# Patient Record
Sex: Female | Born: 1980 | Race: Black or African American | Hispanic: No | Marital: Single | Smoking: Current every day smoker
Health system: Southern US, Community
[De-identification: ages and names within clinical notes are randomized; demographics above are authoritative.]

## PROBLEM LIST (undated history)

## (undated) DIAGNOSIS — D649 Anemia, unspecified: Secondary | ICD-10-CM

## (undated) HISTORY — DX: Anemia, unspecified: D64.9

## (undated) HISTORY — PX: TUBAL LIGATION: SHX77

---

## 2001-03-05 ENCOUNTER — Emergency Department (HOSPITAL_COMMUNITY): Admission: EM | Admit: 2001-03-05 | Discharge: 2001-03-05 | Payer: Self-pay | Admitting: *Deleted

## 2001-04-17 ENCOUNTER — Encounter: Payer: Self-pay | Admitting: *Deleted

## 2001-04-17 ENCOUNTER — Ambulatory Visit (HOSPITAL_COMMUNITY): Admission: RE | Admit: 2001-04-17 | Discharge: 2001-04-17 | Payer: Self-pay | Admitting: *Deleted

## 2001-06-12 ENCOUNTER — Ambulatory Visit (HOSPITAL_COMMUNITY): Admission: RE | Admit: 2001-06-12 | Discharge: 2001-06-13 | Payer: Self-pay | Admitting: *Deleted

## 2001-06-13 ENCOUNTER — Encounter: Payer: Self-pay | Admitting: *Deleted

## 2001-07-02 ENCOUNTER — Ambulatory Visit (HOSPITAL_COMMUNITY): Admission: RE | Admit: 2001-07-02 | Discharge: 2001-07-02 | Payer: Self-pay | Admitting: *Deleted

## 2001-07-28 ENCOUNTER — Ambulatory Visit (HOSPITAL_COMMUNITY): Admit: 2001-07-28 | Discharge: 2001-07-28 | Payer: Self-pay | Admitting: *Deleted

## 2001-08-09 ENCOUNTER — Ambulatory Visit (HOSPITAL_COMMUNITY): Admission: AD | Admit: 2001-08-09 | Discharge: 2001-08-09 | Payer: Self-pay | Admitting: *Deleted

## 2001-08-20 ENCOUNTER — Inpatient Hospital Stay (HOSPITAL_COMMUNITY): Admission: AD | Admit: 2001-08-20 | Discharge: 2001-08-24 | Payer: Self-pay | Admitting: *Deleted

## 2001-08-22 ENCOUNTER — Encounter: Payer: Self-pay | Admitting: *Deleted

## 2001-08-26 ENCOUNTER — Encounter: Payer: Self-pay | Admitting: Internal Medicine

## 2001-08-27 ENCOUNTER — Inpatient Hospital Stay (HOSPITAL_COMMUNITY): Admission: EM | Admit: 2001-08-27 | Discharge: 2001-08-31 | Payer: Self-pay | Admitting: Internal Medicine

## 2001-08-27 ENCOUNTER — Encounter: Payer: Self-pay | Admitting: *Deleted

## 2001-08-30 ENCOUNTER — Encounter: Payer: Self-pay | Admitting: General Surgery

## 2001-09-18 ENCOUNTER — Observation Stay (HOSPITAL_COMMUNITY): Admission: RE | Admit: 2001-09-18 | Discharge: 2001-09-19 | Payer: Self-pay | Admitting: *Deleted

## 2001-12-12 ENCOUNTER — Observation Stay (HOSPITAL_COMMUNITY): Admission: RE | Admit: 2001-12-12 | Discharge: 2001-12-13 | Payer: Self-pay | Admitting: *Deleted

## 2002-10-10 ENCOUNTER — Encounter: Payer: Self-pay | Admitting: Internal Medicine

## 2002-10-10 ENCOUNTER — Emergency Department (HOSPITAL_COMMUNITY): Admission: EM | Admit: 2002-10-10 | Discharge: 2002-10-10 | Payer: Self-pay | Admitting: Internal Medicine

## 2002-11-02 ENCOUNTER — Emergency Department (HOSPITAL_COMMUNITY): Admission: EM | Admit: 2002-11-02 | Discharge: 2002-11-02 | Payer: Self-pay | Admitting: Emergency Medicine

## 2003-10-13 IMAGING — CT CT PELVIS W/ CM
1 of 2 series · 13 of 32 positions shown, 18 images · IV contrast (CONTRAST)
Comparison: none

FINDINGS
CLINICAL DATA: LOWER  ABDOMINAL PAIN, CRAMPS.  FEVER.  ELEVATED WHITE COUNT.  REPORTEDLY, NO BOWEL
MOVEMENT FOR SIX DAYS.  C-SECTION SIX DAYS AGO.
CT ABDOMEN WITH CONTRAST
AXIAL CUTS WERE OBTAINED FOLLOWING ORAL GASTROGRAFIN INGESTION AND IV INFUSION OF 100 CC OMNIPAQUE
300.  THE HEART APPEARS SLIGHTLY ENLARGED, WHICH MAY BE RELATED TO RECENT GRAVID STATE OR POSSIBLY
UNDERLYING PATHOLOGY SUCH AS HYPERTENSIVE CARDIOVASCULAR DISEASE.  MINIMAL PERICARDIAL EFFUSION.
MODERATELY SMALL BILATERAL PLEURAL EFFUSIONS.  BILATERAL LOWER LOBAR PARENCHYMAL LUNG DENSITIES ARE
NOTED.  THERE ARE AIR BRONCHOGRAMS.  I AM CONCERNED THAT THE FINDINGS REPRESENT PNEUMONIA IN
ADDITION TO ATELECTASIS, SOMEWHAT MORE SO ON THE RIGHT THAN THE LEFT.  NO BILIARY DUCTAL DILATATION
OR HEPATIC PARENCHYMAL ABNORMALITY.  THE SPLEEN IS UNREMARKABLE.  NO DEFINITE PANCREATIC
ABNORMALITY.  THE GALLBLADDER APPEARS INTACT.  MILD ASCITES, MAINLY IN THE ANTERIOR PARARENAL
SPACES AND PARACOLIC GUTTERS.  MARKED DILATATION OF THE RENAL COLLECTING SYSTEMS AND URETERS. THE
KIDNEYS DO EXCRETE CONTRAST MATERIAL AS NOTED ON THE DELAYED IMAGES.  LAYERING OF CONTRAST IN THE
DILATED COLLECTING SYSTEMS AND RENAL PELVES IS NOTED.  SMALL AMOUNT OF CONTRAST IS NOTED LAYERING
IN THE DILATED RIGHT URETER.  THE DILATED URETERS CAN THEN BE FOLLOWED DOWN INTO THE PELVIS,
CONTRAST IS NOTED IN MULTIPLE SMALL BOWEL LOOPS.  GAS AND STOOL IN THE COLON IS PRESENT.  FINDINGS
MOST LIKELY REPRESENT AN ILEUS.
IMPRESSION
CT FINDINGS ARE MOST COMPATIBLE WITH ILEUS.  MILD ASCITES, THE ETIOLOGY OF WHICH IS UNCERTAIN.
SMALL PLEURAL EFFUSIONS AND PERICARDIAL EFFUSIONS.  BILATERAL LOWER LOBAR ATELECTASIS/PNEUMONIC
CONSOLIDATIONS.  RATHER MARKED BILATERAL HYDROURETERONEPHROSIS WHICH IN PART MAY BE DUE TO A
DISTENDED URINARY BLADDER AND ALSO PROBABLY DUE TO THE FACT THE PATIENT WAS  RECENTLY IN THE GRAVID
STATE.  THERE  MAY BE SOME RESIDUAL COMPRESSION OF THE URETERS AT THE PELVIC BRIM BY ENLARGED
UTERUS.  RECOMMEND BLADDER CATHETERIZATION.
CT PELVIS WITH CONTRAST
300.  UTERUS IS ENLARGED AND HAS AN INHOMOGENEOUS ATTENUATION COMPATIBLE WITH RECENT POST GRAVID
STATE AND C-SECTION.  THERE IS MILD PELVIC ASCITES.  OVARIES ARE SOMEWHAT PROMINENT IN SIZE
BILATERALLY AND MAY BE RELATED TO THE HORMONAL STATUS.  SOMEWHAT HOURGLASS SHAPED FLUID COLLECTION
VENTRAL AND SUPERIOR TO THE BLADDER IS NOTED THAT MEASURES APPROXIMATELY 3 CM IN MAXIMUM DEPTH AND
APPROXIMATELY 9 CM TRANSVERSELY.  OVAL FLUID COLLECTION IN RECTOUTERINE CUL-DE-SAC APPEARS FAIRLY
WELL DIVIDED AND MAY REPRESENT LOCULATED ASCITES, ABSCESS, OR POSSIBLY SEROMA/HEMATOMA.
POSTOPERATIVE HEMATOMA/SEROMA.  THE URETERS ARE DILATED DOWN TO THE LEVEL OF THE PELVIS WHERE THEY
ARE PROBABLY COMPRESSED SOMEWHAT BY THE POST GRAVID UTERUS.  HORIZONTAL ROW OF SUPERFICIAL STAPLES,
ANTERIOR PELVIC WALLS SECONDARY TO C-SECTION.
MILD PELVIC ASCITES.  ILEUS.  ENLARGED UTERUS, WHICH MAY BE WITHIN NORMAL LIMITS POST REPORTED
RECENT C-SECTION.  FLUID COLLECTION ANTEROPELVIC IN LOCATION, JUST BENEATH THE RECTUS ABDOMINIS
MUSCULATURE.  THIS COULD REPRESENT AN ABSCESS OR POSSIBLY SEROMA/HEMATOMA.  ALSO, NOTE THAT THE
OVAL FLUID COLLECTION IN THE RECTOUTERINE CUL-DE-SAC MAY REPRESENT A LOCULATION OF FREE FLUID OR
POSSIBLY AN ABSCESS OR HEMATOMA.

[Series 7296: — · axial · 0.61mm/px · z∈[+1316,+1736]mm · 13 of 96 slices shown, 18 images]
[im 6/96  soft-tissue]
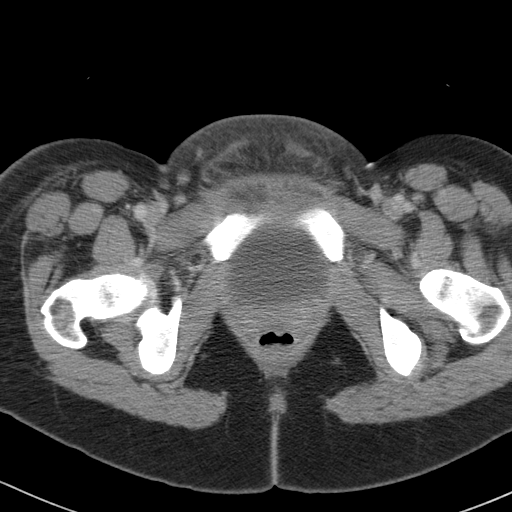
[im 6/96  bone]
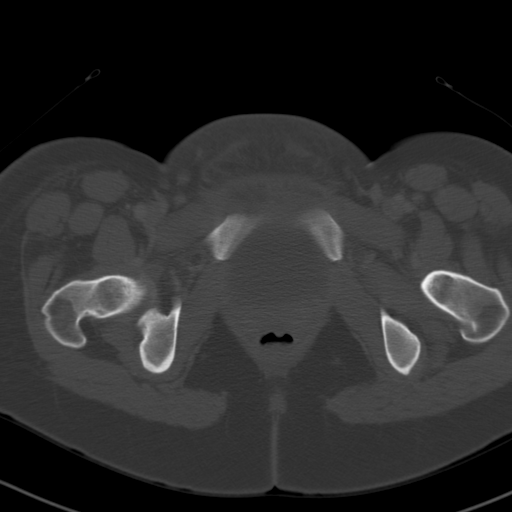
[im 12/96  soft-tissue]
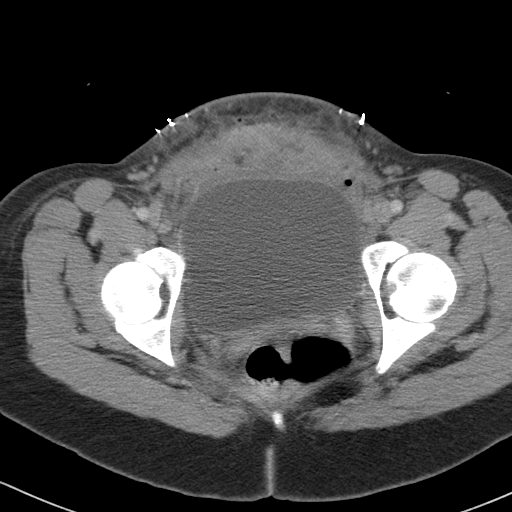
[im 24/96  soft-tissue]
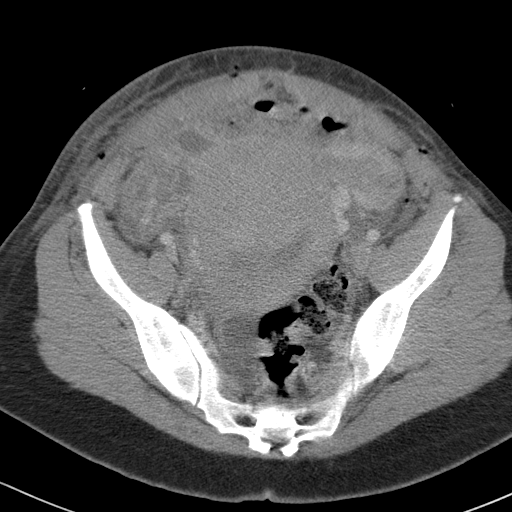
[im 30/96  soft-tissue]
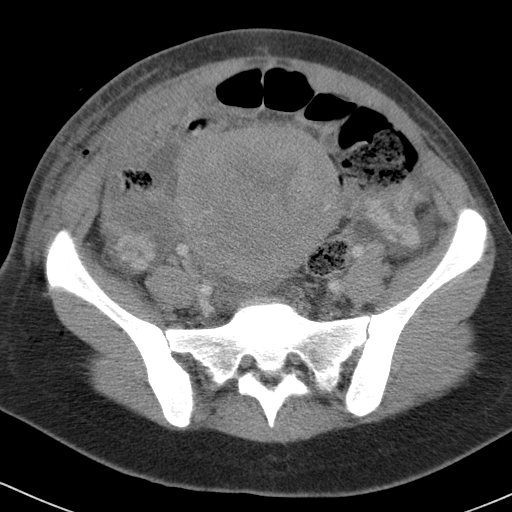
[im 36/96  soft-tissue]
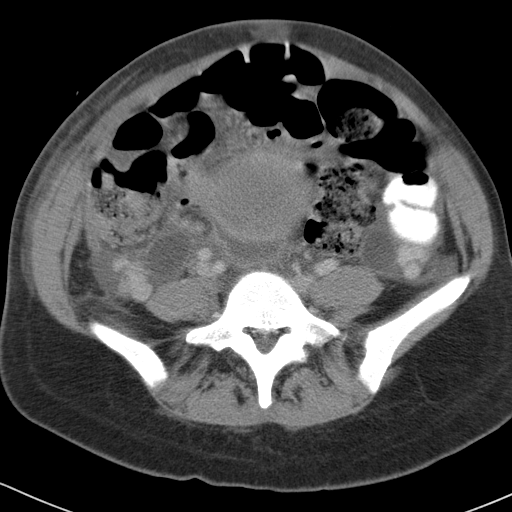
[im 42/96  soft-tissue]
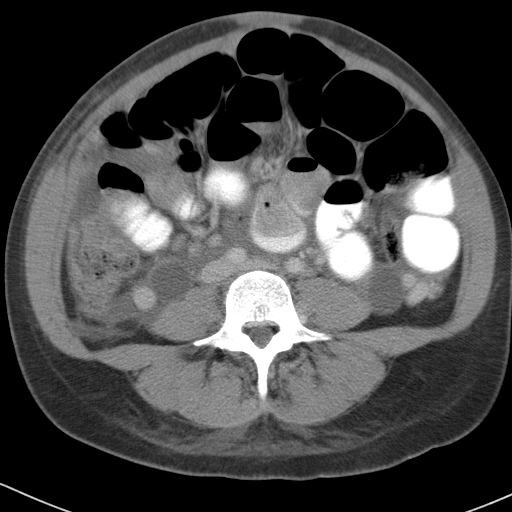
[im 54/96  soft-tissue]
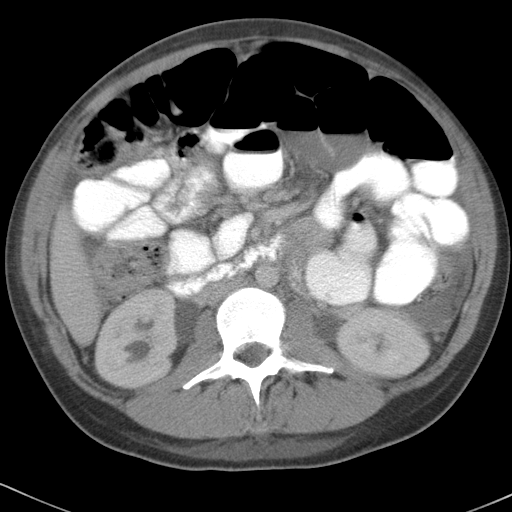
[im 60/96  soft-tissue]
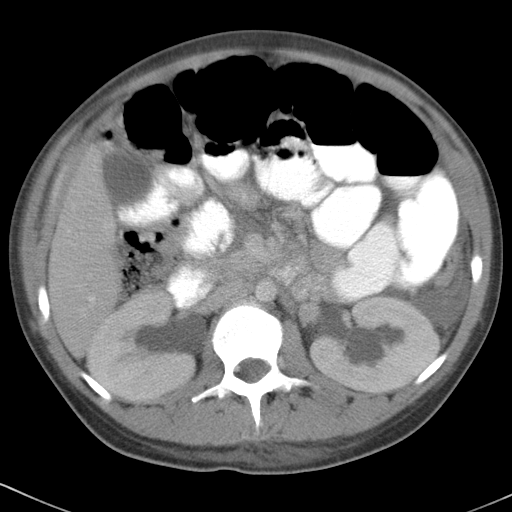
[im 66/96  soft-tissue]
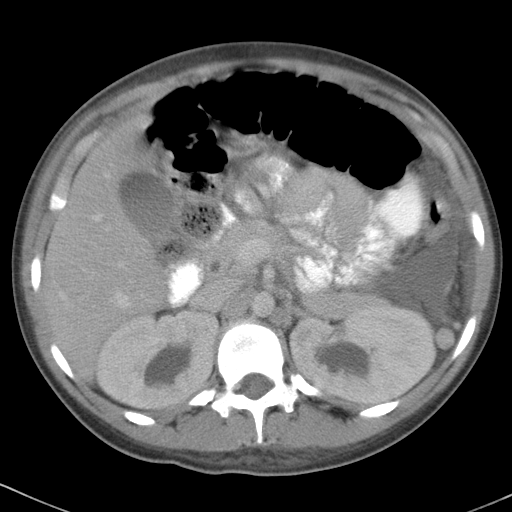
[im 66/96  bone]
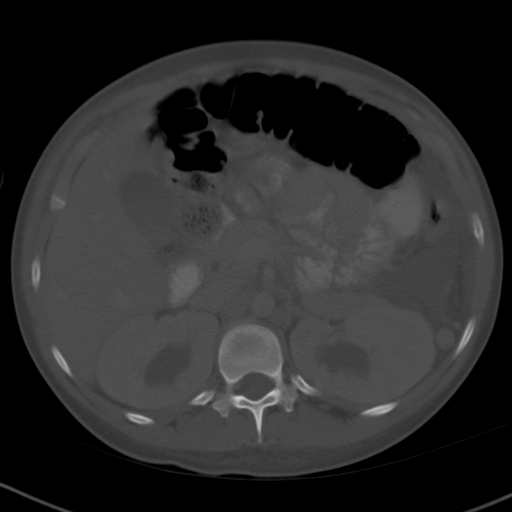
[im 72/96  soft-tissue]
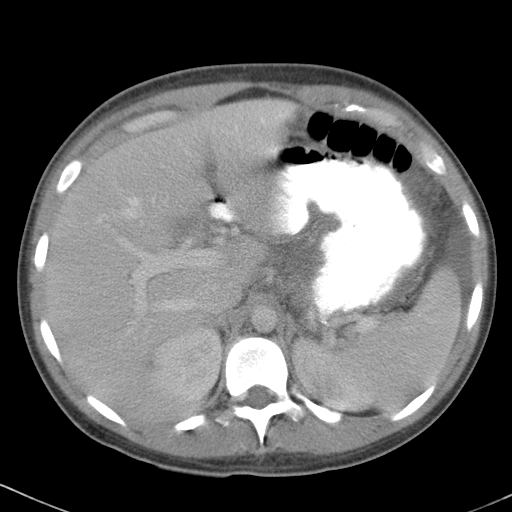
[im 72/96  lung]
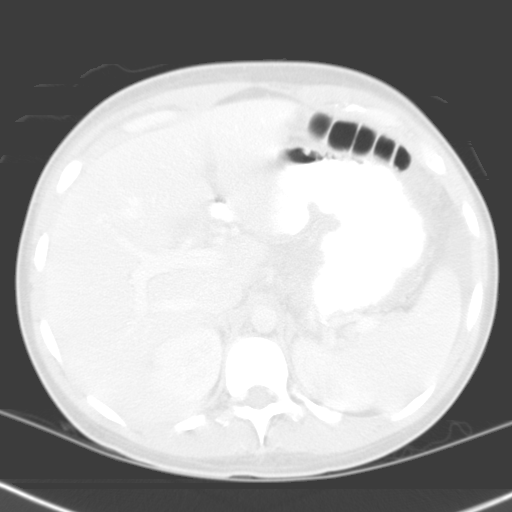
[im 78/96  lung]
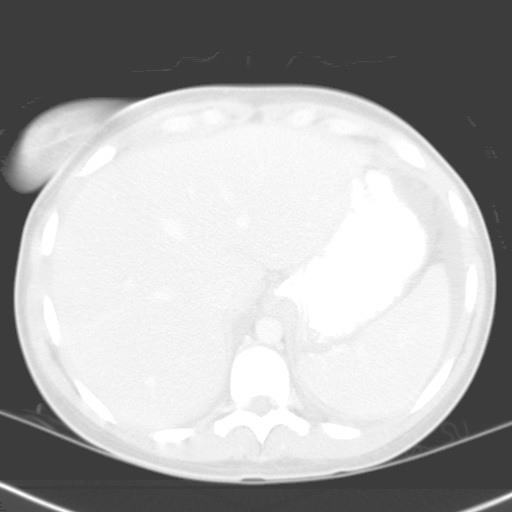
[im 84/96  soft-tissue]
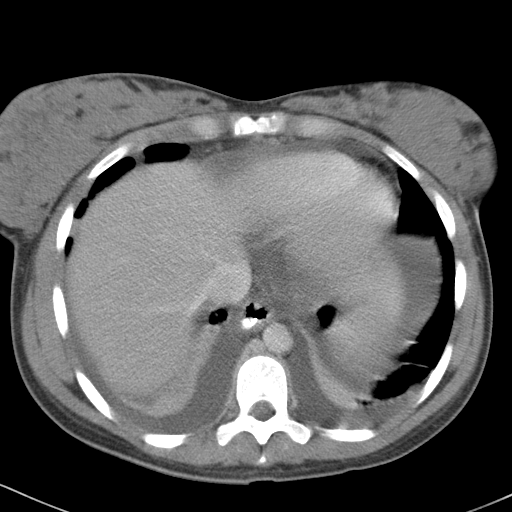
[im 84/96  lung]
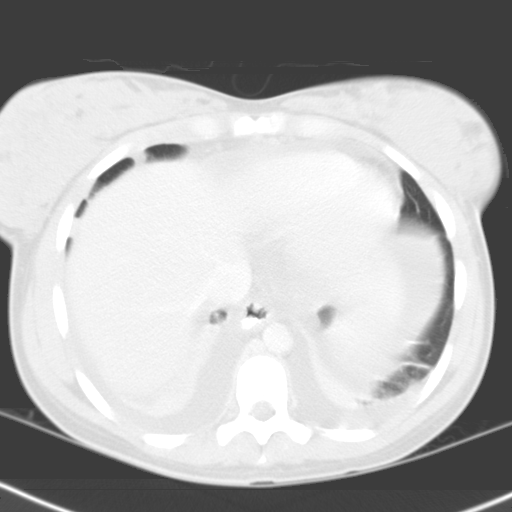
[im 90/96  soft-tissue]
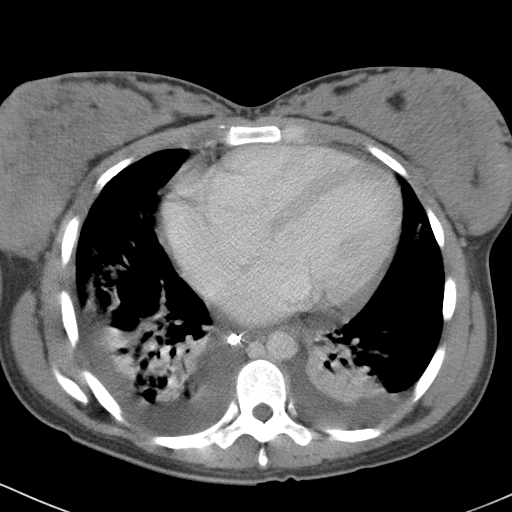
[im 90/96  lung]
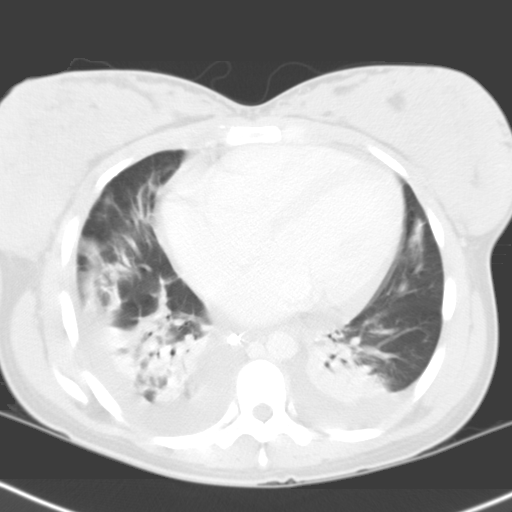

[13 of 32 positions shown; findings below may reference images not displayed]

## 2004-05-05 ENCOUNTER — Emergency Department (HOSPITAL_COMMUNITY): Admission: EM | Admit: 2004-05-05 | Discharge: 2004-05-05 | Payer: Self-pay | Admitting: Emergency Medicine

## 2007-06-28 ENCOUNTER — Emergency Department (HOSPITAL_COMMUNITY): Admission: EM | Admit: 2007-06-28 | Discharge: 2007-06-28 | Payer: Self-pay | Admitting: Family Medicine

## 2007-07-24 ENCOUNTER — Emergency Department (HOSPITAL_COMMUNITY): Admission: EM | Admit: 2007-07-24 | Discharge: 2007-07-24 | Payer: Self-pay | Admitting: Emergency Medicine

## 2007-07-26 ENCOUNTER — Emergency Department (HOSPITAL_COMMUNITY): Admission: EM | Admit: 2007-07-26 | Discharge: 2007-07-26 | Payer: Self-pay | Admitting: Emergency Medicine

## 2007-07-28 ENCOUNTER — Emergency Department (HOSPITAL_COMMUNITY): Admission: EM | Admit: 2007-07-28 | Discharge: 2007-07-28 | Payer: Self-pay | Admitting: Emergency Medicine

## 2007-07-31 ENCOUNTER — Ambulatory Visit (HOSPITAL_COMMUNITY): Admission: RE | Admit: 2007-07-31 | Discharge: 2007-08-01 | Payer: Self-pay | Admitting: Orthopedic Surgery

## 2008-07-31 ENCOUNTER — Emergency Department (HOSPITAL_COMMUNITY): Admission: EM | Admit: 2008-07-31 | Discharge: 2008-07-31 | Payer: Self-pay | Admitting: Emergency Medicine

## 2010-07-27 NOTE — Op Note (Signed)
[name], [name]              ACCOUNT NO.:  [id number]   MEDICAL RECORD NO.:  [id number]          PATIENT TYPE:  AMB   LOCATION:  DAY                          FACILITY:  [facility]   PHYSICIAN:  Dionne Ano. Gramig III, M.D.DATE OF BIRTH:  [birth year]   DATE OF PROCEDURE:  07/31/2007  DATE OF DISCHARGE:                               OPERATIVE REPORT   PREOPERATIVE DIAGNOSIS:  Right ring finger deep abscess with failure of  conservative management.   POSTOPERATIVE DIAGNOSIS:  Right ring finger deep abscess with failure of  conservative management.   PROCEDURE:  1. Removal of large deep mass in the form of an inclusion cyst, right      ring finger volar aspect of the middle phalanx region.  2. I&D (irrigation and debridement), deep infectious fluid.  3. Infected inclusion cyst process with deep abscess.  4. Neurolysis, radial and ulnar digital nerve, right ring finger.   SURGEON:  Dominica Severin, M.D.   ASSISTANT:  Karie Chimera. Infected inclusion cyst process, deep  abscess.   CULTURES:  x1 taken, aerobic and anaerobic.   ESTIMATED BLOOD LOSS:  Minimal.   TOURNIQUET TIME:  Less than an hour.   INDICATIONS FOR THE PROCEDURE:  The patient is a 30 year old female who  presented to the [facility] system emergency room and was  referred to me to take over and treat due to the fact that she was not  improving.  She presented for evaluation, and I have discussed with her  the upper extremity predicament.  She has a large mass which appears to  be infected.  I have discussed with her the issues and the options for  treatment, and she desires to proceed with the above-mentioned  exploration and removal of necrotic structures and decompression of the  abscess as necessary.  I have discussed the risks and benefits including  bleeding, infection, anesthesia, damage to normal structures, and  failure of the surgery to accomplish its intended goals of relieving  symptoms and  restoring function.  With this in mind, she desires to  proceed.  All questions have been encouraged answered preoperatively.   PROCEDURE:  The patient was seen by myself and anesthesia.  Arm was  marked, permit was signed, taken to operative suite.  Under general  anesthetic which was induced by anesthesia department, I performed a  modified barium incision about the finger.  I immediately encountered a  large amount of purulent material.  This was thick, foul-smelling, and  was cultured with aerobic and anaerobic cultures.  This thoroughly  decompressed, and following this, I then debrided the skin and  subcutaneous tissue.  She had a large amount of prenecrotic skin.  Following this, I then identified a capsule indicative of an inclusion  cyst and removed this in its entirety.  This had a stalk over the flexor  sheath region and represented what I felt was an inclusion cyst.  I feel  that her process represents an inclusion cyst which has gone onto a  tremendous infection.  Following this and removal of the inclusion cyst,  I performed neurolysis  of the radial ulnar neurovascular bundles.  I  noted that the radial and ulnar digital nerves were intact, and I did  look at these under 4.0 loupe magnification.  I then irrigated with 3  liters of saline.  I should note that the mass was entangled in the  inclusion cyst wall,both ulnarly and radially and that these were very  carefully removed from this region.  Following irrigation, I put one  small loose stitch in of chromic suture and packed the area with  Iodoform gauze.  The patient tolerated this well.  She had excellent  refill and no complicating features.  She was taken to the recovery room  in stable condition.  I will plan for vancomycin antibiotic, await the  gram stain, and proceed with further antibiotic measures as the cultures  dictated.  I have discussed these issues with the patient at length,  do's and don't's, etc.  We  look forward to participating in her  postoperative progress and will continue on an aggressive course of  action for the infected right ring finger.           ______________________________  Dionne Ano. Everlene Other, M.D.     Nash Mantis  D:  07/31/2007  T:  07/31/2007  Job:  [id number]

## 2010-07-30 NOTE — H&P (Signed)
[facility]  Patient:    [name], [name] Visit Number: [id number] MRN: [id number]          Service Type: OBV Location: 4A A426 01 Attending Physician:  Jeri Cos. Dictated by:   Langley Gauss, M.D. Admit Date:  09/18/2001 Discharge Date: 09/19/2001                           History and Physical  HISTORY OF PRESENT ILLNESS:  This is a 30 year old, gravida 4, para 4, who desires permanent sterilization.  She accepts the inherent 2% failure rate associated with the procedure and understands that it is to be considered an irreversible procedure.  PAST MEDICAL HISTORY:  She has no other medical history.  ALLERGIES:  She has no known drug allergies.  PAST OBSTETRICAL HISTORY:  The patient is three prior vaginal deliveries.  She also recently underwent emergent low transverse cesarean section without complications.  The patient, thereafter, one week postoperatively was readmitted with a postoperative ileus and intrapelvic infection.  PHYSICAL EXAMINATION:  GENERAL:  The patient is in no acute distress.  VITAL SIGNS:  Height 5 foot 7 inches.  Weight is 142 pounds.  Blood pressure 111/67, pulse rate 90, respiratory rate is 20.  HEENT:  Negative.  NECK:  No adenopathy.  Neck is supple.  Thyroid is not palpable.  LUNGS:  Clear.  CARDIOVASCULAR:  Regular rate and rhythm.  ABDOMEN:  Soft and nontender.  A Pfannenstiel is noted from prior cesarean section.  EXTREMITIES:  Normal.  PELVIC:  Normal external genitalia.  No lesions or ulcerations identified. Cervix is without lesions.  Bimanual examination shows normal size multiparous uterus, freely mobile.  No adnexal masses appreciated.  ASSESSMENT:  The patient multiparous adamantly desires permanent sterilization.  She will be admitted on October 14, 2001 for laparoscopic tubal ligation utilizing bipolar cautery.Dictated by:   Langley Gauss, M.D.  Attending Physician:  Jeri Cos. DD:  09/21/01 TD:  09/24/01 Job: [id number]

## 2010-07-30 NOTE — Discharge Summary (Signed)
[facility]  Patient:    [name], [name] Visit Number: [id number] MRN: [id number]          Service Type: OBV Location: 4A A426 01 Attending Physician:  Jeri Cos. Dictated by:   Langley Gauss, M.D. Admit Date:  09/18/2001 Discharge Date: 09/19/2001                             Discharge Summary  The patient is hospitalized on an observation outpatient status.  PROCEDURE PERFORMED:  Laparoscopic tubal ligation and lysing bipolar cautery as well as lysis adhesion.  PERTINENT LABORATORY DATA:  Admission hemoglobin and hematocrit 11.2/33.7 with a white count of 8.1. Postoperative day #1, hemoglobin 10.1, hematocrit 31.0 with a white count of 9.7.  DISCHARGE INSTRUCTIONS:  The patient is given a copy of the standardized discharge instructions at time of discharge.  FOLLOWUP:  She will follow up in the office in one weeks time.  DISCHARGE MEDICATIONS:  Include Lortab 10/500 for pain relief.  HOSPITAL COURSE:  See previous dictation. The patient was continued on observation status the p.m. of September 18, 2001 as I was concerned she may have possibly developed postoperative nausea and vomiting. The patient did have a Foley catheter placed which drained clear yellow urine and an excellent output was noted. The patient did well initially with IV narcotics, was quickly switched over to p.o. Tylox upon which she did very well. The patient remained afebrile throughout the hospital stay, Foley catheter was removed on postoperative day #1 at which time the patient was able to ambulate and void without difficulty. She likewise was able to tolerate a regular general diet as well as p.o. pain medication, and the vital signs remained stable. Thus, the patient was discharged to home on September 19, 2001, given a copy of the standardized discharge instructions. Dictated by:   Langley Gauss, M.D. Attending Physician:  Jeri Cos. DD:  09/21/01 TD:   09/24/01 Job: [id number]

## 2010-07-30 NOTE — Op Note (Signed)
NAMECHELLSEA, BECKERS                        ACCOUNT NO.:  [id number]   MEDICAL RECORD NO.:  [id number]                   PATIENT TYPE:  OBV   LOCATION:  A308                                 FACILITY:  [facility]   PHYSICIAN:  Roylene Reason. Lisette Grinder, M.D.             DATE OF BIRTH:  [birth year]   DATE OF PROCEDURE:  12/12/2001  DATE OF DISCHARGE:  12/13/2001                                 OPERATIVE REPORT   PREOPERATIVE DIAGNOSES:  1. Menometrorrhagia.  2. Dysmenorrhea.   POSTOPERATIVE DIAGNOSES:  1. Menometrorrhagia.  2. Dysmenorrhea.   PROCEDURE:  1. Hysteroscopy.  2. Dilatation and curettage.  3. Laparoscopy with lysis of adhesions.  4. Right ovarian cystectomy.   SURGEON:  Roylene Reason. Lisette Grinder, M.D.   ESTIMATED BLOOD LOSS:  100 cc   SPECIMENS:  Uterine curettings to pathology as well as portions of right  ovarian cyst wall as well as fibrinous material which is obviously adhesive  disease from within the pelvis.   ANESTHESIA:  General endotracheal   DRAINS:  Foley catheter to straight drainage following the procedure   FINDINGS:  Some intrauterine synechiae consistent with the previous  endometritis. The remaining of the intrauterine contents were noted to be  normal.  Within the right ovary there is noted to be a 3 cm simple  follicular cyst which was removed.  In addition, there was noted to be  adhesive disease encountered both within the anterior and posterior cul-de-  sac with very fine, filmy lesions identified.  The left infundibulopelvic  ligament is noted to be markedly thickened.  There is noted to be a left  paraovarian cyst.   DESCRIPTION OF PROCEDURE:  The patient is taken to the operating room and  vital signs stable.  The patient underwent uncomplicated induction of  general endotracheal anesthesia and placed in the lithotomy position and  prepped and draped in the usual sterile manner.   Foley catheter posteriorly placed to straight drainage.  The cervix  was  visualized.  Anterior lip of the cervix was grasped with a single-tooth  tenaculum.  The uterus was noted to sound to a depth of 10 cm, noted to have  only moderate descensus with traction. The uterus is then applied to gentle  traction.  Progressive dilatation was then performed up to a size #17  dilator which allows passage of the hysteroscope.  The distilling medium is  noted to be a sterile normal saline.  The hysteroscopic examination then  reveals some intrauterine synechiae consistent with previous endometritis.  The hysteroscope was then removed.  Curettage was then performed of the  entire uterine cavity until a fine gritty, and appropriate sensation is  appreciated in all quadrants of the uterus with no additional tissue  obtained.  There was no evidence of any residual synechiae.  Uterine fundus,  likewise, noted to be normal after curettage.   The curettage was then discontinued.  The Hulka  intrauterine manipulator was  then clamped on the cervix at 12 o'clock.  I then changed to sterile gloves;  and, standing at the patient's left side, a 1 cm vertical incision was made  inferior to the umbilicus.  A suprapubic midline incision is also made.  The  abdominal wall was then elevated.  The Veress needle was then placed using  subumbilical incision angling towards the fascial plane. This was done  atraumatically.  Drop test confirms the proper intraperitoneal placement.  Pneumoperitoneum is then created by insufflating 3 L of CO2 gas at a filling  pressure of less than 15 mm.  The Veress needle was then removed.  The 10 mm  clear plastic disposable trocar and sleeve were inserted through the  subumbilical incision while angling towards the hollow of the sacrum.  This  was none atraumatically under direct visualization.  The pelvic contents  described as previously described.   Suprapubically under direct visualization a 5 mm trocar and sleeve were  inserted.  Likewise a  left-sided trocar and sleeve were also inserted under  direct visualization.  This allows manipulation and visualization of the  pelvic contents.  The left ovary is noted to be normal in appearance.  The  right ovary is noted to contain the 3 cm follicular cyst.  The unipolar  endoshears were used to cauterize the overlying ovarian capsule which then  allows removal of the cyst wall in a piece-wise fashion.  The bed of the  ovarian cyst was then cauterized with a J-hook cautery to assure hemostasis.  The edges are not sutured closed but reapproximated spontaneously.  At the  right and left fallopian tubes at the site of previous ligation there was  noted to be fibrinopurulent exudate about 0.5 cm in total size both with the  proximal and distal portion of the tubal ligation.  This is removed for  permanent specimen only.  Lysis was then performed with the adhesions within  the anterior and posterior cul-de-sac.  These were all performed easily  under direct visualization.  The left infundibulopelvic ligament was then  noted to be thickened.  There was noted to be normal left tube and ovary.  No adhesive disease encountered within the left ovary and in addition, no  cystic disease encountered.   The procedure was then terminated.  The irrigation was performed until clear  which likewise confirms hemostasis.  There were some small bleeders at the  uterine fundus from previous lysis of adhesion sites which are easily  cauterized with the J-hook cautery.  A large piece of Surgicel cloth was  then placed over the uterine fundus to facilitate postoperative hemostasis.  The procedure was then terminated.  About 100 cc of sterile normal saline  was left within the pelvic cavity.  Instruments are removed as the gas was  allowed to escape and our 3 sites were closed utilizing #0 Vicryl in deep interrupted fashion through the fascia followed by 3-0 nylon suture to close  the subcutaneous tissue.  The  patient tolerated the procedure very well.  Hulka tenaculum is removed from the cervix and the patient is reversed of  anesthesia and taken to the recovery room in stable condition at which time  the operative findings were discussed with the patient's awaiting family.  Foley catheter continues to drain clear yellow urine.  Donald P. Lisette Grinder, M.D.    DPC/MEDQ  D:  12/14/2001  T:  12/18/2001  Job:  [id number]

## 2010-07-30 NOTE — H&P (Signed)
[facility]  Patient:    [name], [name] Visit Number: [id number] MRN: [id number]          Service Type: DSU Location: DAY Attending Physician:  Jeri Cos. Dictated by:   Langley Gauss, M.D. Admit Date:  09/18/2001                           History and Physical  HISTORY OF PRESENT ILLNESS:  The patient is admitted on August 26, 2001 from the emergency room.  She is status post discharge on August 25, 2001, followed by performance of an uncomplicated though emergent low transverse cesarean section on August 20, 2001.  The patients postoperative course at hospitalization was complicated by a postoperative pneumonia.  The patient had been noted to have bilateral infiltrates on chest x-ray.  The patient was noted to have increased WBCs; however, she was at the time of admission preoperatively noted to have a WBC of 22,000.  Her WBC did increase to a maximum of 35,000 during the hospitalization previously but had fallen precipitously to 28,000 at the time of discharge.  CURRENT MEDICATIONS:  1. Zithromax 500 mg p.o. b.i.d.  2. Keflex 500 mg p.o. q.i.d.  It is pertinent to note, however, that the patient has not had these filled since discharge from the hospital on August 25, 2001.  ALLERGIES:  No known drug allergies.  PAST MEDICAL HISTORY:  1. Three prior vaginal deliveries without complications.  2. Low transverse cesarean section x1 on August 20, 2001.  PHYSICAL EXAMINATION:  VITAL SIGNS:  Height 5 feet 7 inches.  Weight 158 pounds.  Blood pressure 132/77, pulse 141, respiratory rate 24.  Temperature is 100.2 degrees.  GENERAL:  The patient complains of intermittent severe discomfort, a crampy-type feeling, but at other times has minimal discomfort.  She does describe the onset of pain as 10/10.  She does appear sedated when I evaluated her.  She has received narcotic medication per the ER staff.  LUNGS:  Bibasilar rales, which do clear with  cough.  CARDIOVASCULAR:  Regular rate and rhythm though tachycardic.  ABDOMEN:  Soft with gaseous distention identified.  Bowel sounds are identified on the right side of the abdomen only, with none present on the left.  The incision is noted to be clean and dry.  There is no edema, no erythema identified.  PELVIC:  Normal external genitalia.  No abnormal discharge or bleeding is identified.  Bimanual examination reveals a six day postoperative uterus with excellent involution.  LABORATORY DATA:  Flat plate and upright of the abdomen has been performed, which revealed no air fluid levels, no free air within the pelvis or abdominal cavity.  The left bowel is noted to be distended throughout but nevertheless patent all the way to the rectum and anus.  ASSESSMENT:  1. Ileus, status post cesarean section performed on August 20, 2001.  2. Pneumonia.  3. Postoperative ileus.  Impression is that this likely represent a     physiologic ileus only.  No evidence of any obstruction or free foreign     bodies within the pelvis.  PLAN:  Thus, the patient is admitted on August 26, 2001 for bowel rest, IV fluids, and antiemetics if clinically indicated.Dictated by:   Langley Gauss, M.D. Attending Physician:  Jeri Cos. DD:  09/07/01 TD:  09/09/01 Job: [id number]

## 2010-07-30 NOTE — Discharge Summary (Signed)
[name], [name] NO.:  [id number]   MEDICAL RECORD NO.:  [id number]                   PATIENT TYPE:   LOCATION:                                       FACILITY:   PHYSICIAN:  Langley Gauss, M.D.                DATE OF BIRTH:   DATE OF ADMISSION:  08/26/2001  DATE OF DISCHARGE:  08/31/2001                                 DISCHARGE SUMMARY   DIAGNOSES:  Status post emergent low transverse cesarean section on August 20, 2001 now readmitted on August 26, 2001.   DISCHARGE DIAGNOSES:  1. Postoperative ileus secondary to postoperative endometritis.  2. Atelectasis.  3. Anemia secondary to blood loss requiring blood transfusion.  4. Intraperitoneal abscess or hematoma as seen on pelvic CT.   DISCHARGE INSTRUCTIONS:  The patient to follow up in the office in one  week's time.   DISCHARGE MEDICATIONS:  1. Chromagen iron replacement therapy one p.o. b.i.d.  2. The patient is to continue with Z-PAK.   LABORATORIES:  At time of admission initial hemoglobin and hematocrit  8.2/24.5.  By August 28, 2001 hemoglobin had fallen to 6.9/20.7.   HOSPITAL COURSE:  The patient initially admitted up through the emergency  room on observation status on August 27, 2001 due to failure of inadequate  therapeutic response.  The patient was admitted.  The patient initially  treated with IV Rocephin and Zithromax as well as Demerol and Phenergan for  pain relief and antiemetic therapy.  Blood cultures had been done in the  emergency room.  Dirk Dress. Katrinka Blazing, M.D. was consulted on August 27, 2001  regarding a postoperative ileus and postpartum state.  He agreed with the  diagnosis with pelvic hematoma versus seroma or abscess as well as the  atelectasis bilaterally.  The patient was continued on the Rocephin and  Zithromax.  In addition, Cleocin 600 mg IV q.6h. was added.  She was treated  with pulmonary treatment with Alupent and Atrovent inhaler.  The patient was  then  admitted with fevers to 102, elevated white blood count, and acute  abdominal distention.  White blood count elevated at 26.6.  Pertinently, the  abdominal series did show dilated small bowel and colon but gas did pass all  the way down to the rectum.  The patient was noted to markedly be improved  by August 28, 2001 with now passage of flatus.  Temperature remained mildly  elevated at 100.6 degrees.  Hemoglobin had fallen to 6.9 with hematocrit of  20.7.  White count had now drooped to 20.3.  The patient was continued on  the IV antibiotics and on August 29, 2001 evaluation by Dirk Dress. Katrinka Blazing, M.D.  revealed her to be quite depressed.  She did have three BMs and large volume  of flatus at that time with a Tmax of 101.8.  White count had improved to  18.4.  On August 30, 2001 though patient was markedly feeling better, she was  anemic.  However, this was very well tolerated by the patient.  The patient  was pretreated and then transfused with 2 units of packed red blood cells on  August 30, 2001 which resulted in improved indices on August 31, 2001 at time of  discharge to a hemoglobin of 8.8, hematocrit of 25.8.  The patient was thus  discharged to home in my absence on August 31, 2001 by Tilda Burrow, M.D.  due to cross coverage arrangement.  She was afebrile, tolerating a regular  general diet.  Appears to be healing well with markedly improved bowel  function with passage of flatus and now BMs.  Overall, her level of  discomfort is markedly diminished and vital signs have been stable.  At time  of discharge patient was noted to be afebrile with last temperature of 101.1  on August 28, 2001 at 2200.  Thereafter she was afebrile for greater than 48  hours' duration at time of discharge.   FINAL DIAGNOSES:  1. Postoperative status post emergent cesarean section.  2. Endometritis with probable intrapelvic abscess and hematoma treated with     intravenous antibiotics as well as transfusion of 2 units  packed red     blood cells.                                               Langley Gauss, M.D.    DC/MEDQ  D:  04/09/2002  T:  04/09/2002  Job:  [id number]

## 2010-07-30 NOTE — H&P (Signed)
   [name], [name]                        ACCOUNT NO.:  [id number]   MEDICAL RECORD NO.:  [id number]                   PATIENT TYPE:  OBV   LOCATION:  A308                                 FACILITY:  [facility]   PHYSICIAN:  Roylene Reason. Lisette Grinder, M.D.             DATE OF BIRTH:  1980-07-16   DATE OF ADMISSION:  12/12/2001  DATE OF DISCHARGE:  12/13/2001                                HISTORY & PHYSICAL   DIAGNOSES:  1. Irregular menses.  2. Dysmenorrhea.   PLANNED PROCEDURES:  D&C, hysteroscopy, and laparoscopy.   HISTORY OF PRESENT ILLNESS:  The patient's past medical history:  She had  spontaneous assisted vaginal delivery x 3 without complications.  Emergent  cesarean section August 20, 2001.  This was complicated by postoperative  endometritis.  The patient underwent tubal ligation September 18, 2001, at which  time adhesive disease was encountered during the operative procedure.   PAST MEDICAL HISTORY:  Negative.   ALLERGIES:  No known drug allergies.   MEDICATIONS:  Current medications:  None.  Regular medications:  None.   PHYSICAL EXAMINATION:  VITAL SIGNS:  Height 5 feet 5 inches, weight 135.  115/70, 80, 20.  HEENT:  Negative.  Mucous membranes are moist.  LUNGS:  Clear.  CARDIOVASCULAR:  Regular rate and rhythm.  ABDOMEN:  Soft and nontender.  EXTREMITIES:  Noted to be normal.  PELVIC:  Normal external genitalia.  Cervix without lesions.  Uterus is  noted to be anteflexed, slightly tender, decreased mobility.  There is noted  to be tenderness bilaterally in the adnexal region, but no discrete adnexal  mass is palpated.   DIAGNOSES:  1. Menometrorrhagia.  2. Dysmenorrhea.  3. History of adhesive disease.    PLAN:  The patient was treated preoperatively with Keflex 500 b.i.d. x 7  days as well as Lortab.  Plan is on December 12, 2001, D&C, hysteroscopy,  laparoscopy.                                               Donald P. Lisette Grinder, M.D.    DPC/MEDQ  D:  12/14/2001   T:  12/15/2001  Job:  [id number]

## 2010-07-30 NOTE — Op Note (Signed)
[facility]  Patient:    [name], [name] Visit Number: [id number] MRN: [id number]          Service Type: MED Location: 4A A427 01 Attending Physician:  Jeri Cos. Dictated by:   Langley Gauss, M.D. Proc. Date: 08/21/01 Admit Date:  08/20/2001   CC:         [name], [name]   Operative Report  DIAGNOSES: 1. 36-week intrauterine pregnancy. 2. Preterm labor. 3. Nonreassuring fetal status with fetal tachycardia and decelerations.  PROCEDURE PERFORMED: Primary low transverse cesarean section, delivery of 2,560 gm female infant.  SURGEON:  Roylene Reason. Lisette Grinder, M.D.  ESTIMATED BLOOD LOSS:  800 cc.  ANESTHESIA:  General endotracheal.  PEDIATRICIAN:  [name], [name]  DRAINS:  Foley catheter straight drainage.  ESTIMATED BLOOD LOSS:  800 cc.  URINE OUTPUT DURING THIS PROCEDURE:  200 cc clear yellow urine.  SUMMARY:  This is a 30 year old gravida 4, para 3, at [redacted] weeks gestation who presents to [facility] complaining of 2-hour history of uterine contractions.  The patient denies any vaginal bleeding, leakage of fluid, or any change in vaginal discharge.  When the patient presented, I was advised that after being placed on the fetal monitor, she was noted to have fetal heart rate baseline of 180-190 with some decelerations noted.  She was contracting q. 3-5 minutes, and on examination by the nursing staff, reportedly 4-5 cm dilated with bulging membranes.  I was advised to come evaluate the patient secondary to the abnormal-appearing fetal heart rate. When I arrived, Dr. [name] who had been on the floor already was in the patient room, performing an ultrasound which revealed a single intrauterine pregnancy with fetal cardiac activity identified, normal amniotic fluid volume noted, vertex presentation noted. He paid particular attention, looking for a nuchal cord.  No nuchal cord was noted.  Thereafter, after performing  the ultrasound, the fetal heart tones were again continued to be monitored by the external fetal monitor.  Again, they were noted to be 180-190 with some prolonged decelerations down to 80-90 beats per minute noted.  When I examined the patient, she was known to be 8 cm dilated, initially 60% effaced, with rapid progression to 90% effacement.  Vertex had dropped from a -1 station down to a 0 station.  Fetal scalp electrode was placed with resultant amniotomy, and clear amniotic fluid was noted.  The fetal heart rate continued to be nonreassuring with decreased long- and short-term variability with the tachycardia noted.  In contact with both the nursing supervisor and the operating room, it was found that the hernia was ongoing at that time.  Thus, preparations were made to proceed with emergent C-section, and at 22:55, I was notified that the hernia had been finished and the staff would begin setting up for the stat cesarean section.  [name], C.R.N.A., evaluated the patient, [name], on the labor and delivery floor.  An incision was made at 23:27 with delivery of the infant at 23:29, Dr. [name] in attendance.  PROCEDURE:  Fetal heart tones were monitored by fetal scalp electrode up until the time the patient left the fourth floor. She was taken to the operating room, placed on the OR table, slight left lateral tilt, prepped and draped in the usual sterile manner with a Foley catheter draining clear yellow urine. The patient then underwent uncomplicated induction of general endotracheal anesthesia.  A sharp knife was then used to incise a Pfannenstiel incision through the skin.  We dissected down to the fascial plane using a sharp knife, cauterizing all bleeders along the way.  The fascia was then incised in a transverse curvilinear manner utilizing the Mayo scissors while sharply dissecting out the underlying rectus muscles.  The rectus fascia was then grasped in the midline  utilizing the Allis clamps and dissected off the underlying rectus muscle in the midline.  This allowed the rectus muscles to be bluntly separated. The peritoneal cavity was atraumatically bluntly entered at the superiormost portion of the incision.  The peritoneal incision was then extended superiorly and inferiorly.  Inferiorly, we directly visualized the bladder to avoid accidental injury.  The bladder blade was then placed.  A sharper knife was then used to score a low transverse uterine incision from the well-developed lower segment.  Clear amniotic fluid was noted in the midportion of the uterine incision.  My index finger was then used to bluntly extend the uterine incision low transverse in a bilateral manner.  My right hand then reached into the uterine cavity.  The head of the infant was flexed and elevated to the level of the uterine incision.  A disposable suction was then placed on the infants vertex, connected to wall suction.  Gentle traction combined with fundal pressure resulted in easy delivery of the vertex through the uterine incision without extension. The mouth and nares of the infant were bulb-suctioned of clear amniotic fluid.  Gentle traction combined with fundal pressure resulted in the delivery of the remainder of the infant without difficulty.  The umbilical cord was then progressively moved towards the infant. The cord was doubly clamped and cut, and the infant was handed to the awaiting pediatrician, Dr. [name], for immediate assessment.  At this point in time, arterial cord gas and cord blood were obtained from the placenta. There was difficulty obtaining a cord gas sample as I had milked the cord towards the infant prior to clamping and cutting the cord.  The placenta was noted to spontaneously separate.  Intrauterine exploration reveals no retained placental fragments.  The uterus was then exteriorized and closed in two layers with 0 chromic in a running locked  fashion, the second layer being an  imbricating layer to result in hemostasis.  The tubes and ovaries were noted to be normal in appearance. Excellent uterine tone was achieved following delivery of the tubes and ovaries.  The cul-de-sac was then irrigated free of all clots.  The uterus was returned to the pelvic cavity.  The peritoneal edges were grasped using Kelly clamps.  Sponge and instrument counts were correct x2 at this point.  Continued irrigation was performed of the pelvic cavity until clear.  Of note, the patient had prenatally stated the desire for permanent sterilization, but all hospital consent forms had not been completed prior to the procedure for permanent sterilization.  In addition with the uncertain status of the infant following delivery with Apgars of 4 and 7, tubal ligation was not performed, and the patient and family were notified of this immediately following the procedure.  The tubes and ovaries likewise as stated previously were noted to be normal in appearance.  The uterus was closed.  The cul-de-sac was irrigated free of all clots. The uterus was returned to the pelvic cavity.  The peritoneal edges were grasped using Kelly clamps, and the peritoneum and rectus muscles were closed utilizing 0 chromic in a running fascia.  The fascia was then closed with a continuous running #1 PDS suture.  The subcutaneous  area was noted to be hemostatically dry.  Three interrupted #1 Maxon sutures were then placed through-and-through the skin edges to facilitate reapproximation of the skin in the midline, which allowed easily stapling of the entirity of the incision.  A total of 20 cc of 0.5% Bupivacaine were then injected subcutaneously and along the entirity of the incision to facilitate postoperative analgesia.  The patient was then reversed from anesthesia and taken to the recovery room in stable condition.  The operative findings were discussed with the patients family  and the nursery, discussing the patient with Dr. [name].  The infant is noted to have bilateral infiltrates, consistent with either bilateral pneumonia or hyaline membrane disease.  The infant will be transferred to [facility].  Notably at this time, I was made aware that the patients initial laboratory studies revealed a white blood count of 28.2, hemoglobin of 9.2 and 27.4.  Hematocrit had been noted prior to the procedure.  After extubating, the patient was taken to the recovery room in stable condition and after appropriate recovery time was taken to the fourth floor to see the infant prior to the baby being transferred for [facility]. Dictated by:   Langley Gauss, M.D. Attending Physician:  Jeri Cos. DD:  08/21/01 TD:  08/22/01 Job: [id number]

## 2010-07-30 NOTE — Op Note (Signed)
[facility]  Patient:    [name], [name] Visit Number: [id number] MRN: [id number]          Service Type: OBV Location: 4A A426 01 Attending Physician:  Jeri Cos. Dictated by:   Langley Gauss, M.D. Proc. Date: 09/18/01 Admit Date:  09/18/2001 Discharge Date: 09/19/2001                             Operative Report  PREOPERATIVE DIAGNOSIS:  Desires permanent sterilization.  POSTOPERATIVE DIAGNOSIS:  Desires permanent sterilization.  PROCEDURE PERFORMED:  Laparoscopic tubal ligation utilizing bipolar cautery.  SURGEON:  Roylene Reason. Lisette Grinder, M.D.  ANESTHESIA:  General endotracheal.  SPECIMENS:  None.  FINDINGS AT TIME OF SURGERY:  Include multiparous-size uterus with good uterine descensus with gentle traction. There was noted to be a significant adhesive disease encountered within the pelvis with large bowel adherent to the fundal portion of the uterus, as well as in the anterior cul-de-sac with very fine, filmy adhesions.  Likewise, the left tube was noted to be adherent to the anterior abdominal wall at the level of the Pfannenstiel incision.  SUMMARY:  The patients vital signs were stable.  The patient was taken to the operating room where she underwent an uncomplicated induction of general endotracheal anesthesia.  The patient was then placed in the low lithotomy position, prepped and draped in the usual sterile manner utilizing Betadine solution.  A red rubber catheter was used to drain about 200 cc of clear yellow urine from the bladder.  A speculum was then placed within the vaginal vault. The cervix was visualized and was noted to be normal in appearance. The anterior lip of the cervix was grasped with a single-tooth tenaculum.  A Hulka anterior uterine manipulator was then passed through the endocervical os and used to grasp the cervix at 12 oclock.  Speculum and tenaculum were then removed.  Standing at the patients left side,  a 1 cm vertical incision was made just inferior to the umbilicus.  The abdominal wall was then elevated. The Veress needle was then passed through this subumbilical incision, angulating perpendicular to the fascial plane. This was done atraumatically with a proper intraperitoneal place.  Drop test then confirmed proper intraperitoneal placement.  Pneumoperitoneum was then created by insufflating 3 liters of CO2 gas and a filling pressure of less than 15 mmHg.  After assurance of adequate pneumoperitoneum, the Veress needle was removed.  The 10 mm disposable trocar and sleeve were threaded through the subumbilical incision, angling towards the hila of the sacrum. This was done atraumatically.  The trocar was then removed.  The laparoscope was then used to visualize the pelvic contents as previously described.  Under direct visualization, a 5 mm reusable trocar and sleeve were then inserted just suprapubically in the midline under direct visualization. This was done atraumatically with no bowel or vascular injury occurring.  Due to the adhesive disease, I initially was forced to proceed with lysis of adhesions. The very fine filmy adhesions between the large colon and the anterior cul-de-sac, as well as the fundus of the uterus were easily lysed with gentle manipulation of the bowel, which completely freed up large bowel from the anterior cul-de-sac, as well from the uterine fundus. Each of the fallopian tubes was then positively identified by tracing it from its entry into the uterus to the fimbriated end.  The left tube was adherent to the anterior abdominal wall as previously described.  Laparoscopic scissors were used to sharply dissect left fallopian tube free from the anterior abdominal wall in an avascular plane.  This resulted in complete freeing up of the left fallopian tube.  The Klepinger bipolar cauterization forceps were then inserted through the suprapubic incision under direct  visualization.  Each of the fallopian tubes was then triply cauterized with the most proximal being 1 cm from the tubouterine junction.  At each time, the cauterization was continued to result in extensive tubular destruction and continued until the cauterizing current falls down to 0.  This does result in extensive tubular destruction bilaterally of both the right and left fallopian tubes.  Copious irrigation was then performed of the pelvic cavity.  There was noted to be some active bleeding from the anterior cul-de-sac which was then cauterized utilizing the bipolar Klepinger forceps to assure hemostasis.  Clots were evacuated from the right adnexal region of the pelvis.  The irrigation was performed until clear. Notably, the anterior cul-de-sac was free of all large bowel at this time.  No significant adhesive disease remains within the pelvis.  To facilitate postoperative hemostasis, two portions of Surgicel were then placed in the anterior cul-de-sac between the uterus and the anterior abdominal wall.  This was done atraumatically and results and confirms excellent hemostasis at the anterior cul-de-sac.  The procedure was then terminated.  Our instruments were removed, and gas was allowed to escape.  The two incision sites were closed utilizing 0 Vicryl in deep interrupted fashion through-and-through the fascia.  The skin edges were reapproximated utilizing 3-0 Vicryl in a superficial interrupted fashion to reapproximate the skin edges.  The patient tolerated the procedure very well.  The Hulka uterine manipulator was then removed from the endocervical os.  The patient is reversed from anesthesia and taken to the recovery room in stable condition, at which time operative findings were discussed with the patients awaiting family. Dictated by:   Langley Gauss, M.D. Attending Physician:  Jeri Cos. DD:  09/21/01 TD:  09/24/01 Job: [id number]

## 2010-07-30 NOTE — Discharge Summary (Signed)
   [name], [name]                        ACCOUNT NO.:  [id number]   MEDICAL RECORD NO.:  [id number]                   PATIENT TYPE:  OBV   LOCATION:  A308                                 FACILITY:  [facility]   PHYSICIAN:  Roylene Reason. Lisette Grinder, M.D.             DATE OF BIRTH:  1980/05/16   DATE OF ADMISSION:  12/12/2001  DATE OF DISCHARGE:  12/13/2001                                 DISCHARGE SUMMARY   Status post laparoscopy discharged home on December 13, 2001.   DISCHARGE MEDICATIONS:  1. Lortab 10/500 with no refill.  2. Hemocyte-F one p.o. q.d. for iron deficiency anemia.   PERTINENT LABORATORY STUDIES:  Hemoglobin 11.3, hematocrit 34.9,  postoperative day one 9.1.   HOSPITAL COURSE:  The patient underwent the described operative procedure on  December 12, 2001. Postoperatively she was referred to the third floor as an  observation patient. She continues to have clear and copious urine output as  documented by a Foley catheter. She had adequate urine output throughout the  evening, she had excellent pain relief just with the IV Buprenex.  Postoperatively the patient did have some nausea and then vomited once on  the day of discharge; however, thereafter she was able to tolerate p.o.  intake, regular gentle diet, likewise tolerated p.o. narcotics for pain  relief. Vital signs remain stable and the patient was ambulatory. She was  discharged home on December 13, 2001 with instructions to follow-up in the  office in one weeks time.                                               Donald P. Lisette Grinder, M.D.    DPC/MEDQ  D:  12/14/2001  T:  12/18/2001  Job:  [id number]

## 2010-07-30 NOTE — Discharge Summary (Signed)
[facility]  Patient:    [name], [name] Visit Number: [id number] MRN: [id number]          Service Type: MED Location: 4A A420 [id number] Attending Physician:  Jeri Cos. Dictated by:   Langley Gauss, M.D. Admit Date:  08/26/2001 Disc. Date: 08/24/01                             Discharge Summary  DIAGNOSES: 1. A 36-week intrauterine pregnancy. 2. Preterm labor. 3. Fetal heart rate tachycardia with fetal heart rate decelerations.  PROCEDURE PERFORMED:  Primary low transverse cesarean section delivery of 2565 g female infant.  COMPLICATIONS OF HOSPITALIZATION:  The patient was febrile with decreased breath sounds.  On chest x-ray, she was noted to have bilateral bibasilar infiltrates.  DISCHARGE MEDICATIONS:  Discharge medications thus include Tylox for pain relief.  In addition, the patient will be treated with Zithromax 500 mg p.o. b.i.d. x10 days.  During her hospitalization, she was treated with IV Rocephin with the finding of pneumonia.  Zithromax 500 mg IV q.24h. was added to this regimen and continued throughout the hospitalization.  LABORATORY STUDIES:  Pertinent laboratory studies had admission hemoglobin and hematocrit of 9.2/27.4 with a white count of 28.2.  The patient was thereafter noted to have serial WBC counts performed which were all elevated due to the postoperative status in addition to the bilateral pneumonia.  On serial days the patients white blood count was 35 and 35; however, on day of discharge, August 24, 2001, the white blood cell count had improved to 25.9 with a hemoglobin of 7.9, hematocrit 23.6.  HOSPITAL COURSE:  See previous dictations.  Postoperatively, the patient as stated previously was noted to be febrile with a T-max of 101 and in addition was noted to have bilateral bibasilar rales which were found to be due to bilateral pneumonia.  The patient after initiation of the IV antibiotics and initiation of  incentive spirometry was also treated with a bed side Proventil inhaler although she has no history of asthma.  The patient did well on this regimen.  With increasing activity, she was able to have improvement in her productive cough.  She was able to advance her diet to tolerate regular general diet.  Thus on August 24, 2001 with her status markedly improved tolerating p.o. narcotics for pain relief the patient was discharged home.  DISPOSITION:  To follow up in the office in 3 days time for staple removal from Pfannenstiel incision. Dictated by:   Langley Gauss, M.D. Attending Physician:  Jeri Cos. DD:  08/25/01 TD:  08/28/01 Job: [id number]

## 2010-12-08 LAB — CREATININE, SERUM
Creatinine, Ser: 0.76
GFR calc non Af Amer: >60

## 2010-12-08 LAB — HEMOGLOBIN AND HEMATOCRIT, BLOOD: Hemoglobin: 13.8

## 2010-12-08 LAB — PREGNANCY, URINE: Preg Test, Ur: NEGATIVE

## 2010-12-08 LAB — ANAEROBIC CULTURE

## 2010-12-08 LAB — CULTURE, ROUTINE-ABSCESS

## 2014-09-02 ENCOUNTER — Encounter (HOSPITAL_COMMUNITY): Payer: Self-pay | Admitting: *Deleted

## 2014-09-02 ENCOUNTER — Emergency Department (HOSPITAL_COMMUNITY)
Admission: EM | Admit: 2014-09-02 | Discharge: 2014-09-02 | Disposition: A | Discharge status: Home / Self Care | Payer: Medicaid Other | Attending: Emergency Medicine | Admitting: Emergency Medicine

## 2014-09-02 DIAGNOSIS — N63 Unspecified lump in breast: Secondary | ICD-10-CM | POA: Diagnosis not present

## 2014-09-02 DIAGNOSIS — Z720 Tobacco use: Secondary | ICD-10-CM | POA: Diagnosis not present

## 2014-09-02 DIAGNOSIS — Z853 Personal history of malignant neoplasm of breast: Secondary | ICD-10-CM | POA: Diagnosis not present

## 2014-09-02 DIAGNOSIS — N6320 Unspecified lump in the left breast, unspecified quadrant: Secondary | ICD-10-CM

## 2014-09-02 DIAGNOSIS — N6325 Unspecified lump in the left breast, overlapping quadrants: Secondary | ICD-10-CM

## 2014-09-02 NOTE — ED Notes (Signed)
Pt reports having a mass to left breast x 3 months, denies any swelling, redness or pain.

## 2014-09-02 NOTE — ED Notes (Signed)
Declined W/C at D/C and was escorted to lobby by RN. 

## 2014-09-02 NOTE — ED Provider Notes (Signed)
CSN: [id number]     Arrival date & time 09/02/14  0749 History   First MD Initiated Contact with Patient 09/02/14 0756     Chief Complaint  Patient presents with  . Breast Mass     (Consider location/radiation/quality/duration/timing/severity/associated sxs/prior Treatment) HPI   Pt presents to the ED for evaluation of a breast lump that has gradually gotten larger since she noticed it three months ago.  It is tender when she is having her menses.  She denies any nipple discharge, swelling or redness near the mass, has not had any fever, chills, CP, SOB, N/V or unexpected weight loss. LMP was 08/13/14.  She has four children, one of which delievered preterm, she did not breastfeed any of them.  She reports history of breast cancer in the family - mother at 57 yo with mastectomy.  Maternal grandmother had ovarian CA, age of dx unknown.  She denies any genetic work up with an OB/GYN.   History reviewed. No pertinent past medical history. History reviewed. No pertinent past surgical history. History reviewed. No pertinent family history. History  Substance Use Topics  . Smoking status: Current Every Day Smoker    Types: Cigarettes  . Smokeless tobacco: Not on file  . Alcohol Use: No   OB History    No data available     Review of Systems  Constitutional: Negative for fever, fatigue and unexpected weight change.  Cardiovascular: Negative for chest pain.  Gastrointestinal: Negative for abdominal pain.  Skin: Negative.     Allergies  Review of patient's allergies indicates no known allergies.  Home Medications   Prior to Admission medications   Not on File   BP 135/80 mmHg  Pulse 111  Temp(Src) 98.8 F (37.1 C) (Oral)  Resp 18  Ht  (1.702 m)  Wt 136 lb 11.2 oz (62.007 kg)  BMI 21.41 kg/m2  SpO2 100%  LMP 08/12/2014   Physical Exam  Constitutional: She is oriented to person, place, and time. She appears well-developed and well-nourished. No distress.  HENT:  Head:  Normocephalic and atraumatic.  Right Ear: External ear normal.  Left Ear: External ear normal.  Nose: Nose normal.  Eyes: Conjunctivae and EOM are normal. Pupils are equal, round, and reactive to light. Right eye exhibits no discharge. Left eye exhibits no discharge. No scleral icterus.  Neck: Normal range of motion. No JVD present. No tracheal deviation present.  Cardiovascular: Normal rate and regular rhythm.   Pulmonary/Chest: Effort normal and breath sounds normal. No respiratory distress. Right breast exhibits no inverted nipple, no mass, no nipple discharge, no skin change and no tenderness. Left breast exhibits mass. Left breast exhibits no inverted nipple, no nipple discharge, no skin change and no tenderness.  No edema or erythema around mass, no puckering of skin near mass, no peau d'orange appearance No other masses palpated No supraclavicular lymphadenopathy No axillary lymphadenopathy  Genitourinary: No breast swelling, tenderness, discharge or bleeding. Pelvic exam was performed with patient supine.  Large circular breast mass palpated and visible located on left breast at the 9 o'clock  Musculoskeletal: Normal range of motion. She exhibits no edema or tenderness.  Neurological: She is alert and oriented to person, place, and time. She exhibits normal muscle tone. Coordination normal.  Skin: Skin is warm and dry. No rash noted. She is not diaphoretic. No erythema. No pallor.  Psychiatric: She has a normal mood and affect. Her behavior is normal. Judgment and thought content normal.  Nursing note and vitals  reviewed.    ED Course  Procedures (including critical care time) Labs Review Labs Reviewed - No data to display  Imaging Review No results found.   EKG Interpretation None      EMERGENCY DEPARTMENT US SOFT TISSUE INTERPRETATION "Study: Limited Ultrasound of the noted body part in comments below"  INDICATIONS: Other (refer to comments) Multiple views of the body  part are obtained with a multi-frequency linear probe  PERFORMED BY:  Myself  IMAGES ARCHIVED?: Yes  SIDE:Left  BODY PART:Breast  FINDINGS: No abcess noted, Cellulitis absent and Other mass present  LIMITATIONS:   INTERPRETATION:  No abcess noted and No cellulitis noted - discrete, well circumscribed, oval mass, homogeneous and hypoechoic with echogenic border  COMMENT:  US obtained for evaluation of mass, indicated to assess ability to perform aspiration   MDM   Final diagnoses:  None    Patient presents to fast-track this morning with lump in her left breast the 9 to 10:00 position, that has been there for 3 months and gradually increased in size. There is no superficial swelling of the skin, no erythema. The lump is mobile and discrete, circular approximately 4 cm x 4 cm, not fluctuant.  Patient has a history of breast cancer from her mother, mastectomy at 34, and maternal grandmother ovarian cancer at unknown age.  Thorough OB history is obtained, advised patient to seek mammogram from PCP and/or referral to OB/GYN.  Lump was ultrasounded revealing well circumscribed, circular, homogenous, hypoechoic mass found, no drainable fluid noted.   Patient agrees to plan to follow-up with PCP, she has new insurance, and will seek to get established today for continuation of care.  Pt was also [facility] she may be able to expedite a mammogram.        Danelle Berry, PA-C 09/04/14 1812  Toy Cookey, MD 09/05/14 1504

## 2014-09-02 NOTE — Discharge Instructions (Signed)
Recommend follow-up with PCP with mammogram testing and/or OB/GYN referral.  Breast Scan Breast scan is procedure done to examine dense breast tissue, which is difficult in a normal mammogram. It is used in women with breast lesions from fibrocystic disease, fibroadenoma, and fat necrosis. It also is used to determine the course of treatment for breast cancer. LET [facility] CARE PROVIDER KNOW ABOUT:  Any allergies you have.  All medicines you are taking, including vitamins, herbs, eye drops, creams, and over-the-counter medicines.  Previous problems you or members of your family have had with the use of anesthetics.  Any blood disorders you have.  Previous surgeries you have had.  Medical conditions you have.  Pregnancy or the possibility that you may be pregnant. RISKS AND COMPLICATIONS Generally, this is a safe procedure. However, as with any procedure, complications can occur. Possible complications include:   Slight discomfort from injection of radioactive substance.  Allergic reaction to contrast or radioactive substance used in exam. BEFORE THE PROCEDURE No fasting or sedation is required. PROCEDURE   You will be asked to remove all jewelry and clothing from the waist up.  An IV tube will be inserted in your arm or hand opposite the side of the breast to be examined. If both breasts are being evaluated, the IV tube may be inserted into a vein in the foot.  You will be positioned face down on a table. The breast to be imaged will be placed through an opening in the table.  The radioactive agent will be injected into the IV tube. You may experience a slight metallic taste after the injection.  Imaging will begin a few minutes after the injection. A scanner will be placed over the breast and will record the radiation given off.  You may also be asked to get into different positions during the scan.  When the scan is complete, the IV tube is removed. AFTER THE  PROCEDURE 1. You will be asked to get up slowly from the scanner to avoid light-headedness from lying flat during the procedure. 2. Drink plenty of fluids to help flush the remaining radioactive agent from your body. Document Released: 03/25/2004 Document Revised: 03/05/2013 Document Reviewed: 11/05/2012 [facility] Patient Information 2015 [place], [place]. This information is not intended to replace advice given to you by your health care provider. Make sure you discuss any questions you have with your health care provider.  Breast Self-Awareness Breast self-awareness allows you to notice a breast problem early while it is still small. Do a breast self-exam:  Every month, 5-7 days after your period (menstrual period).  At the same time each month if you do not have periods anymore. Look for any:  Difference between your breasts (size, shape, or position).  Change in breast shape or size.  Fluid or blood coming from your nipples.  Changes in your nipples (dimpling, nipple movement).   Change in skin color or texture (redness, scaly areas). Feel for:  Lumps.  Bumps.  Dips.  Any other changes. HOW TO DO A BREAST SELF-EXAM Look at your breasts and nipples. 3. Take off all your clothes above your waist. 4. Stand in front of a mirror in a room with good lighting. 5. Put your hands on your hips and push your hands downward. Feel your breasts.  1. Lie flat on your back or stand in the shower or tub. If you are in the shower or tub, have wet, soapy hands. 2. Place your right arm above your head. 3.  Place your left hand in the right underarm area. 4. Make small circles using the pads (not the fingertips) of your 3 middle fingers. Press lightly and then with medium and firm pressure. 5. Move your fingers a little lower and make the small circles at the 3 pressures (light, medium, and firm). 6. Continue moving your fingers lower and making circles until you reach the bottom of your  breast. 7. Move your fingers one finger-width towards the center of the body. 8. Continue making the circles, this time moving upward until you reach the bottom of your neck. 9. Move your fingers one finger-width towards the center of your body. 10. Make circles downward when starting at the bottom of the neck. Make circles upward when starting at the bottom of the breast. Stop when you reach the middle of the chest. 11.  Repeat these steps on the other breast. Write down what looks and feels normal for each breast. Also write down any changes you notice. GET HELP RIGHT AWAY IF:  You see any changes in your breasts or nipples.  You see skin changes.  You have unusual discharge from your nipples.  You feel a new lump.  You feel unusually thick areas. Document Released: 08/17/2007 Document Revised: 02/15/2012 Document Reviewed: 06/15/2011 [facility] Patient Information 2015 [place], [place]. This information is not intended to replace advice given to you by your health care provider. Make sure you discuss any questions you have with your health care provider.

## 2014-09-18 ENCOUNTER — Other Ambulatory Visit: Payer: Self-pay | Admitting: Radiology

## 2018-09-11 ENCOUNTER — Ambulatory Visit: Payer: Self-pay | Admitting: *Deleted

## 2018-09-11 ENCOUNTER — Other Ambulatory Visit: Payer: Self-pay

## 2018-09-11 DIAGNOSIS — Z20822 Contact with and (suspected) exposure to covid-19: Secondary | ICD-10-CM

## 2018-09-11 NOTE — Telephone Encounter (Signed)
Patient is concerned that she has lost sense of taste and smell for 4 days- patient states she was at [facility] 6/13-21. She is concerned about COVID exposure.  Patient does not have PCP- recommend PCP for care and she will establish with one. Patient does not have insurance at tis time.Testing ordered per symptoms and travel risk.  Reason for Disposition . [1] COVID-19 infection suspected by caller or triager AND [2] mild symptoms (cough, fever, or others) AND [3] no complications or SOB  Answer Assessment - Initial Assessment Questions 1. COVID-19 DIAGNOSIS: "Who made your Coronavirus (COVID-19) diagnosis?" "Was it confirmed by a positive lab test?" If not diagnosed by a HCP, ask "Are there lots of cases (community spread) where you live?" (See public health department website, if unsure)     Patient is exposure/symptoms 2. ONSET: "When did the COVID-19 symptoms start?"      4 days ago 3. WORST SYMPTOM: "What is your worst symptom?" (e.g., cough, fever, shortness of breath, muscle aches)     No taste and smell, body ache(back) 4. COUGH: "Do you have a cough?" If so, ask: "How bad is the cough?"       2 days ago- nothing today 5. FEVER: "Do you have a fever?" If so, ask: "What is your temperature, how was it measured, and when did it start?"     No  6. RESPIRATORY STATUS: "Describe your breathing?" (e.g., shortness of breath, wheezing, unable to speak)      Breathing is normal 7. BETTER-SAME-WORSE: "Are you getting better, staying the same or getting worse compared to yesterday?"  If getting worse, ask, "In what way?"     Staying the same 8. HIGH RISK DISEASE: "Do you have any chronic medical problems?" (e.g., asthma, heart or lung disease, weak immune system, etc.)     no 9. PREGNANCY: "Is there any chance you are pregnant?" "When was your last menstrual period?"     No- LMP- 3 weeks ago 10. OTHER SYMPTOMS: "Do you have any other symptoms?"  (e.g., chills, fatigue, headache, loss of  smell or taste, muscle pain, sore throat)       no  Protocols used: CORONAVIRUS (COVID-19) DIAGNOSED OR SUSPECTED-A-AH

## 2018-09-15 LAB — NOVEL CORONAVIRUS, NAA: SARS-CoV-2, NAA: NOT DETECTED

## 2022-04-24 ENCOUNTER — Emergency Department (HOSPITAL_COMMUNITY)
Admission: EM | Admit: 2022-04-24 | Discharge: 2022-04-24 | Disposition: A | Discharge status: Home / Self Care | Payer: BC Managed Care – PPO | Attending: Emergency Medicine | Admitting: Emergency Medicine

## 2022-04-24 ENCOUNTER — Encounter (HOSPITAL_COMMUNITY): Payer: Self-pay | Admitting: Emergency Medicine

## 2022-04-24 DIAGNOSIS — N6321 Unspecified lump in the left breast, upper outer quadrant: Secondary | ICD-10-CM | POA: Diagnosis present

## 2022-04-24 MED ORDER — HYDROCODONE-ACETAMINOPHEN 5-325 MG PO TABS
1.0000 | ORAL_TABLET | Freq: Once | ORAL | Status: AC
  Administered 2022-04-24: 1 via ORAL
  Filled 2022-04-24: qty 1

## 2022-04-24 MED ORDER — HYDROCODONE-ACETAMINOPHEN 5-325 MG PO TABS: 1.0000 | ORAL_TABLET | Freq: Four times a day (QID) | ORAL | 0 refills | Status: DC | PRN

## 2022-04-24 NOTE — ED Triage Notes (Signed)
Pt here from home with c/o left breast mass that has been there about 2 weeks , pain radiates into her armpit , pt has hx of calcium deposits in that breast removed 2 years ago

## 2022-04-24 NOTE — ED Provider Notes (Signed)
[facility] Provider Note   CSN: [id number] Arrival date & time: 04/24/22  1832     History  Chief Complaint  Patient presents with   Breast Mass    [name] is a 42 y.o. female here presenting with breast mass.  Patient states that she noticed breast mass on the lateral aspect of her left breast for the last 2 weeks.  She states that it is progressively getting larger.  She states that she had similar mass several years ago and had mammogram and biopsy that showed calcium deposit.  Patient has not had mammogram or follow-up in the last several years.  The history is provided by the patient.       Home Medications Prior to Admission medications   Not on File      Allergies    Patient has no known allergies.    Review of Systems   Review of Systems  Respiratory:         Breast mass  All other systems reviewed and are negative.   Physical Exam Updated Vital Signs BP 121/76 (BP Location: Right Arm)   Pulse 91   Temp 99.4 F (37.4 C) (Oral)   Resp 13   SpO2 99%  Physical Exam Vitals and nursing note reviewed.  HENT:     Head: Normocephalic.     Nose: Nose normal.     Mouth/Throat:     Mouth: Mucous membranes are moist.  Eyes:     Extraocular Movements: Extraocular movements intact.     Pupils: Pupils are equal, round, and reactive to light.  Cardiovascular:     Rate and Rhythm: Normal rate and regular rhythm.     Pulses: Normal pulses.  Pulmonary:     Effort: Pulmonary effort is normal.     Breath sounds: Normal breath sounds.     Comments: I examined her breasts with a chaperone.  Patient has a large tender mass about 10 cm in diameter in the left upper breast.  There is no obvious fluctuance or signs of cellulitis.  There is no nipple discharge.  Patient has some left axillary lymphadenopathy Abdominal:     General: Abdomen is flat.     Palpations: Abdomen is soft.  Musculoskeletal:        General:  Normal range of motion.     Cervical back: Normal range of motion.  Skin:    General: Skin is warm.     Capillary Refill: Capillary refill takes less than 2 seconds.  Neurological:     General: No focal deficit present.     Mental Status: She is alert and oriented to person, place, and time.  Psychiatric:        Mood and Affect: Mood normal.        Behavior: Behavior normal.     ED Results / Procedures / Treatments   Labs (all labs ordered are listed, but only abnormal results are displayed) Labs Reviewed - No data to display  EKG None  Radiology No results found.  Procedures Procedures    Medications Ordered in ED Medications - No data to display  ED Course/ Medical Decision Making/ A&P                             Medical Decision Making [name] is a 42 y.o. female here with a breast mass.  Patient has large left breast  mass that was noted 2 weeks ago.  Patient has no signs of cellulitis.  I will refer her to breast center for follow-up and mammogram and ultrasound and possible biopsy.  Will give pain medicine   Problems Addressed: Mass of upper outer quadrant of left breast: acute illness or injury    Final Clinical Impression(s) / ED Diagnoses Final diagnoses:  None    Rx / DC Orders ED Discharge Orders     None         Drenda Freeze, MD 04/24/22 1918

## 2022-04-24 NOTE — Discharge Instructions (Signed)
I have referred you to the breast clinic across the street.  Please give them a call tomorrow for appointment.  You likely will need mammogram and possibly an ultrasound  I have also prescribed Vicodin as needed for pain.  See your doctor for follow-up  Return to ER if you have worse breast pain or discharge or fever

## 2022-04-26 ENCOUNTER — Ambulatory Visit (INDEPENDENT_AMBULATORY_CARE_PROVIDER_SITE_OTHER): Payer: BC Managed Care – PPO | Admitting: Obstetrics and Gynecology

## 2022-04-26 ENCOUNTER — Encounter: Payer: Self-pay | Admitting: Obstetrics and Gynecology

## 2022-04-26 VITALS — BP 111/64 | HR 72 | Ht 67.0 in | Wt 132.0 lb

## 2022-04-26 DIAGNOSIS — N63 Unspecified lump in unspecified breast: Secondary | ICD-10-CM | POA: Diagnosis not present

## 2022-04-26 NOTE — Progress Notes (Signed)
[name] presents as ER f/u for breast mass. Noted 2-3 months ago H/O breast mass 4 yrs ago, s/p Bx, benign, calcified. Strong FH of breast Ca, mother and maternal aunts  PE AF VSS Chaperone present  Breast: sym, no nipple discharge, no adenopathy, bilateral masses Right breast, 4-5 cm, firm, rubbery, mobile, 1-2 cm from nipple, 9-12 o'clock Left breast, 8-10 cm, firm, rubbery, mobile, 1-2 cm from nipple, 10-2 o'clock  A/P Bilateral breast masses Referral for Dx mammogram F/U per test results

## 2022-05-06 ENCOUNTER — Other Ambulatory Visit: Payer: Self-pay

## 2022-08-13 ENCOUNTER — Other Ambulatory Visit: Payer: Self-pay

## 2022-08-13 ENCOUNTER — Emergency Department (HOSPITAL_COMMUNITY)
Admission: EM | Admit: 2022-08-13 | Discharge: 2022-08-13 | Discharge status: Against Medical Advice | Payer: BC Managed Care – PPO | Attending: Emergency Medicine | Admitting: Emergency Medicine

## 2022-08-13 ENCOUNTER — Encounter (HOSPITAL_COMMUNITY): Payer: Self-pay | Admitting: Emergency Medicine

## 2022-08-13 DIAGNOSIS — H5789 Other specified disorders of eye and adnexa: Secondary | ICD-10-CM | POA: Diagnosis present

## 2022-08-13 DIAGNOSIS — Z5321 Procedure and treatment not carried out due to patient leaving prior to being seen by health care provider: Secondary | ICD-10-CM | POA: Insufficient documentation

## 2022-08-13 NOTE — ED Notes (Signed)
Bacitracin and gauze applied in triage

## 2022-08-13 NOTE — ED Triage Notes (Signed)
Pt in with bump to R outer eyebrow that she noticed 2 days ago, that she popped. Over the past day, warmth and swelling has spread to R eyelid. Bump is still draining, pt denies any blurred vision, is just reporting pain to area.

## 2022-08-14 ENCOUNTER — Encounter (HOSPITAL_BASED_OUTPATIENT_CLINIC_OR_DEPARTMENT_OTHER): Payer: Self-pay | Admitting: Emergency Medicine

## 2022-08-14 ENCOUNTER — Emergency Department (HOSPITAL_BASED_OUTPATIENT_CLINIC_OR_DEPARTMENT_OTHER)
Admission: EM | Admit: 2022-08-14 | Discharge: 2022-08-14 | Disposition: A | Discharge status: Home / Self Care | Payer: BC Managed Care – PPO | Attending: Emergency Medicine | Admitting: Emergency Medicine

## 2022-08-14 DIAGNOSIS — R220 Localized swelling, mass and lump, head: Secondary | ICD-10-CM | POA: Diagnosis present

## 2022-08-14 DIAGNOSIS — L0201 Cutaneous abscess of face: Secondary | ICD-10-CM | POA: Insufficient documentation

## 2022-08-14 DIAGNOSIS — F17210 Nicotine dependence, cigarettes, uncomplicated: Secondary | ICD-10-CM | POA: Insufficient documentation

## 2022-08-14 MED ORDER — DOXYCYCLINE HYCLATE 100 MG PO CAPS: 100.0000 mg | ORAL_CAPSULE | Freq: Two times a day (BID) | ORAL | 0 refills | Status: AC

## 2022-08-14 MED ORDER — DOXYCYCLINE HYCLATE 100 MG PO TABS
100.0000 mg | ORAL_TABLET | Freq: Once | ORAL | Status: AC
  Administered 2022-08-14: 100 mg via ORAL
  Filled 2022-08-14: qty 1

## 2022-08-14 NOTE — ED Provider Notes (Signed)
[facility] Provider Note: Lowella Dell, MD, FACEP  CSN: [id number] MRN: [id number] ARRIVAL: 08/14/22 at 0104 ROOM: DB015/DB015   CHIEF COMPLAINT  Facial Swelling   HISTORY OF PRESENT ILLNESS  08/14/22 3:41 AM [name] is a 42 y.o. female who had a "bump" above her right eyebrow that started 2 or 3 days ago.  She squeezed it and got some fluid out.  Since then the bump has gotten larger and more tender.  She rates the pain as a 4 out of 10.  She has edema of the right upper eyelid but no pain or tenderness of the eyelid.  She has no change in vision.   Past Medical History:  Diagnosis Date   Anemia     Past Surgical History:  Procedure Laterality Date   TUBAL LIGATION      Family History  Problem Relation Age of Onset   Uterine cancer Maternal Grandmother    Lung cancer Maternal Grandfather    Cancer Father    Prostate cancer Father    Diabetes Mother    Hypertension Mother    Breast cancer Mother    Uterine cancer Maternal Aunt    Breast cancer Maternal Aunt     Social History   Tobacco Use   Smoking status: Every Day    Packs/day: .5    Types: Cigarettes   Smokeless tobacco: Never  Vaping Use   Vaping Use: Never used  Substance Use Topics   Alcohol use: No   Drug use: Yes    Types: Marijuana    Prior to Admission medications   Medication Sig Start Date End Date Taking? Authorizing Provider  doxycycline (VIBRAMYCIN) 100 MG capsule Take 1 capsule (100 mg total) by mouth 2 (two) times daily. One po bid x 7 days 08/14/22  Yes Lloyd Cullinan, MD    Allergies Patient has no known allergies.   REVIEW OF SYSTEMS  Negative except as noted here or in the History of Present Illness.   PHYSICAL EXAMINATION  Initial Vital Signs Blood pressure 117/83, pulse 88, temperature 98 F (36.7 C), temperature source Oral, resp. rate 20, last menstrual period 08/12/2022, SpO2 100 %.  Examination General: Well-developed, well-nourished female in no  acute distress; appearance consistent with age of record HENT: normocephalic; atraumatic; tender, nonfluctuant nodule above right eyebrow Eyes: Normal appearance of eyes; edema of right eyelid without erythema or warmth Neck: supple Heart: regular rate and rhythm Lungs: clear to auscultation bilaterally Abdomen: soft; nondistended; nontender; bowel sounds present Extremities: No deformity; full range of motion Neurologic: Awake, alert and oriented; motor function intact in all extremities and symmetric; no facial droop Skin: Warm and dry Psychiatric: Normal mood and affect   RESULTS  Summary of this visit's results, reviewed and interpreted by myself:   EKG Interpretation  Date/Time:    Ventricular Rate:    PR Interval:    QRS Duration:   QT Interval:    QTC Calculation:   R Axis:     Text Interpretation:         Laboratory Studies: No results found for this or any previous visit (from the past 24 hour(s)). Imaging Studies: No results found.  ED COURSE and MDM  Nursing notes, initial and subsequent vitals signs, including pulse oximetry, reviewed and interpreted by myself.  Vitals:   08/14/22 0112  BP: 117/83  Pulse: 88  Resp: 20  Temp: 98 F (36.7 C)  TempSrc: Oral  SpO2: 100%   Medications  doxycycline (  VIBRA-TABS) tablet 100 mg (has no administration in time range)    The lesion appears to be an early abscess.  It is not fluctuant I do not believe I&D is indicated at the present time.  Also an I&D could cause a negative cosmetic outcome.  We will try treating with doxycycline and the patient was advised to return if symptoms worsen (spreading of pain and swelling, pointing).  PROCEDURES  Procedures   ED DIAGNOSES     ICD-10-CM   1. Abscess of eyebrow  L02.01          Nazario Russom, Jonny Ruiz, MD 08/14/22 [phone]

## 2022-08-14 NOTE — ED Triage Notes (Signed)
Swelling to right eye brow and upper eye lid.  Reports "popping a bump" earlier in the day and now has swelling. No drainage at this time

## 2023-07-31 ENCOUNTER — Emergency Department (HOSPITAL_BASED_OUTPATIENT_CLINIC_OR_DEPARTMENT_OTHER)
Admission: EM | Admit: 2023-07-31 | Discharge: 2023-07-31 | Discharge status: Against Medical Advice | Attending: Emergency Medicine | Admitting: Emergency Medicine

## 2023-07-31 ENCOUNTER — Other Ambulatory Visit: Payer: Self-pay

## 2023-07-31 DIAGNOSIS — H5789 Other specified disorders of eye and adnexa: Secondary | ICD-10-CM | POA: Insufficient documentation

## 2023-07-31 DIAGNOSIS — H5710 Ocular pain, unspecified eye: Secondary | ICD-10-CM | POA: Diagnosis present

## 2023-07-31 DIAGNOSIS — Z5321 Procedure and treatment not carried out due to patient leaving prior to being seen by health care provider: Secondary | ICD-10-CM | POA: Insufficient documentation

## 2023-07-31 NOTE — ED Triage Notes (Signed)
 Pt POV reporting persistent eye pain and redness after poking herself in eye this morning.

## 2023-07-31 NOTE — ED Notes (Addendum)
 No answer when called times 3 by Camie Cease, RN
